# Patient Record
Sex: Male | Born: 1990 | Race: White | Hispanic: No | Marital: Single
Health system: Southern US, Community
[De-identification: ages and names within clinical notes are randomized; demographics above are authoritative.]

---

## 2020-12-17 ENCOUNTER — Other Ambulatory Visit: Payer: Self-pay

## 2020-12-17 ENCOUNTER — Emergency Department (HOSPITAL_COMMUNITY)
Admission: EM | Admit: 2020-12-17 | Discharge: 2020-12-17 | Disposition: A | Discharge status: Home / Self Care | Attending: Emergency Medicine | Admitting: Emergency Medicine

## 2020-12-17 DIAGNOSIS — R42 Dizziness and giddiness: Secondary | ICD-10-CM | POA: Diagnosis not present

## 2020-12-17 DIAGNOSIS — R4781 Slurred speech: Secondary | ICD-10-CM | POA: Diagnosis not present

## 2020-12-17 DIAGNOSIS — T510X1A Toxic effect of ethanol, accidental (unintentional), initial encounter: Secondary | ICD-10-CM | POA: Insufficient documentation

## 2020-12-17 DIAGNOSIS — Z789 Other specified health status: Secondary | ICD-10-CM

## 2020-12-17 LAB — CBC WITH DIFFERENTIAL/PLATELET
Abs Immature Granulocytes: 0.01 10*3/uL (ref 0.00–0.07)
Basophils Absolute: 0.1 10*3/uL (ref 0.0–0.1)
Basophils Relative: 1 %
Eosinophils Absolute: 0.1 10*3/uL (ref 0.0–0.5)
Eosinophils Relative: 1 %
HCT: 43.3 % (ref 39.0–52.0)
Hemoglobin: 14.8 g/dL (ref 13.0–17.0)
Immature Granulocytes: 0 %
Lymphocytes Relative: 49 %
Lymphs Abs: 3.8 10*3/uL (ref 0.7–4.0)
MCH: 32.1 pg (ref 26.0–34.0)
MCHC: 34.2 g/dL (ref 30.0–36.0)
MCV: 93.9 fL (ref 80.0–100.0)
Monocytes Absolute: 0.5 10*3/uL (ref 0.1–1.0)
Monocytes Relative: 6 %
Neutro Abs: 3.4 10*3/uL (ref 1.7–7.7)
Neutrophils Relative %: 43 %
Platelets: 160 10*3/uL (ref 150–400)
RBC: 4.61 MIL/uL (ref 4.22–5.81)
RDW: 13.4 % (ref 11.5–15.5)
WBC: 7.9 10*3/uL (ref 4.0–10.5)
nRBC: 0 % (ref 0.0–0.2)

## 2020-12-17 LAB — COMPREHENSIVE METABOLIC PANEL
ALT: 130 U/L — ABNORMAL HIGH (ref 0–44)
AST: 84 U/L — ABNORMAL HIGH (ref 15–41)
Albumin: 4.5 g/dL (ref 3.5–5.0)
Alkaline Phosphatase: 56 U/L (ref 38–126)
Anion gap: 9 (ref 5–15)
BUN: 9 mg/dL (ref 6–20)
CO2: 26 mmol/L (ref 22–32)
Calcium: 8.8 mg/dL — ABNORMAL LOW (ref 8.9–10.3)
Chloride: 106 mmol/L (ref 98–111)
Creatinine, Ser: 0.88 mg/dL (ref 0.61–1.24)
GFR, Estimated: >60 mL/min (ref >60)
Glucose, Bld: 117 mg/dL — ABNORMAL HIGH (ref 70–99)
Potassium: 4 mmol/L (ref 3.5–5.1)
Sodium: 141 mmol/L (ref 135–145)
Total Bilirubin: 0.4 mg/dL (ref 0.3–1.2)
Total Protein: 7.7 g/dL (ref 6.5–8.1)

## 2020-12-17 LAB — ETHANOL: Alcohol, Ethyl (B): 121 mg/dL — ABNORMAL HIGH (ref <10)

## 2020-12-17 MED ORDER — SODIUM CHLORIDE 0.9 % IV BOLUS
1000.0000 mL | Freq: Once | INTRAVENOUS | Status: AC
  Administered 2020-12-17: 21:00:00 1000 mL via INTRAVENOUS

## 2020-12-17 NOTE — ED Provider Notes (Signed)
[facility]-EMERGENCY DEPT Provider Note   CSN: [id number] Arrival date & time: 12/17/20  1937     History Chief Complaint  Patient presents with  . Ingestion    Randall Griffin is a 29 y.o. male.  Patient is a 29 year old male with no significant past medical history.  He is brought from the jail for evaluation of ingestion.  Patient and his cellmate apparently got a hold of liquid hand sanitizer and each consumed this.  Patient was brought here for slurred speech and dizziness.  He denies to me he is having any abdominal pain or vomiting.  The history is provided by the patient.  Ingestion This is a new problem. The problem occurs constantly. The problem has not changed since onset.Nothing aggravates the symptoms. Nothing relieves the symptoms.       No past medical history on file.  There are no problems to display for this patient.        No family history on file.     Home Medications Prior to Admission medications   Not on File    Allergies    Patient has no allergy information on record.  Review of Systems   Review of Systems  All other systems reviewed and are negative.   Physical Exam Updated Vital Signs BP 132/80   Pulse 69   Temp 98 F (36.7 C) (Axillary)   Resp (!) 22   SpO2 100%   Physical Exam Vitals and nursing note reviewed.  Constitutional:      General: He is not in acute distress.    Appearance: He is well-developed and well-nourished. He is not diaphoretic.  HENT:     Head: Normocephalic and atraumatic.     Mouth/Throat:     Mouth: Oropharynx is clear and moist.  Cardiovascular:     Rate and Rhythm: Normal rate and regular rhythm.     Heart sounds: No murmur heard. No friction rub.  Pulmonary:     Effort: Pulmonary effort is normal. No respiratory distress.     Breath sounds: Normal breath sounds. No wheezing or rales.  Abdominal:     General: Bowel sounds are normal. There is no distension.      Palpations: Abdomen is soft.     Tenderness: There is no abdominal tenderness.  Musculoskeletal:        General: No edema. Normal range of motion.     Cervical back: Normal range of motion and neck supple.  Skin:    General: Skin is warm and dry.  Neurological:     General: No focal deficit present.     Mental Status: He is alert and oriented to person, place, and time.     Cranial Nerves: No cranial nerve deficit.     Motor: No weakness.     Coordination: Coordination normal.     ED Results / Procedures / Treatments   Labs (all labs ordered are listed, but only abnormal results are displayed) Labs Reviewed  ETHANOL - Abnormal; Notable for the following components:      Result Value   Alcohol, Ethyl (B) 121 (*)    All other components within normal limits  CBC WITH DIFFERENTIAL/PLATELET  COMPREHENSIVE METABOLIC PANEL  URINALYSIS, ROUTINE W REFLEX MICROSCOPIC  RAPID URINE DRUG SCREEN, HOSP PERFORMED    EKG None  Radiology No results found.  Procedures Procedures (including critical care time)  Medications Ordered in ED Medications  sodium chloride 0.9 % bolus 1,000 mL (1,000 mLs  Intravenous New Bag/Given 12/17/20 2034)    ED Course  I have reviewed the triage vital signs and the nursing notes.  Pertinent labs & imaging results that were available during my care of the patient were reviewed by me and considered in my medical decision making (see chart for details).    MDM Rules/Calculators/A&P  Patient brought here by EMS from the jail for evaluation of hand sanitizer ingestion.  Patient drank hand sanitizer to become intoxicated.  He denies other ingestion.  He denies any suicidal or homicidal ideation.  Patient has been observed for nearly 3 hours.  He appears stable and has no complaints.  His work-up is unremarkable with the exception of blood alcohol level of 121.  At this point, I see no indication for admission or other work-up.  Patient will be discharged  with as needed return.  Final Clinical Impression(s) / ED Diagnoses Final diagnoses:  None    Rx / DC Orders ED Discharge Orders    None       Geoffery Lyons, MD 12/17/20 2203

## 2020-12-17 NOTE — ED Triage Notes (Signed)
Patient present after drinking hand sanitizer in jail. Patient currently intoxicated. He has no complaints.   136/72 BP 72 HR 16 RR

## 2020-12-17 NOTE — Discharge Instructions (Addendum)
Return to the emergency department if you develop severe abdominal pain, vomiting, or other new and concerning symptoms.

## 2020-12-18 ENCOUNTER — Emergency Department (HOSPITAL_COMMUNITY)
Admission: EM | Admit: 2020-12-18 | Discharge: 2020-12-19 | Disposition: A | Discharge status: Home / Self Care | Attending: Emergency Medicine | Admitting: Emergency Medicine

## 2020-12-18 ENCOUNTER — Emergency Department (HOSPITAL_COMMUNITY)

## 2020-12-18 ENCOUNTER — Other Ambulatory Visit: Payer: Self-pay

## 2020-12-18 DIAGNOSIS — J338 Other polyp of sinus: Secondary | ICD-10-CM

## 2020-12-18 DIAGNOSIS — R7401 Elevation of levels of liver transaminase levels: Secondary | ICD-10-CM | POA: Insufficient documentation

## 2020-12-18 DIAGNOSIS — J339 Nasal polyp, unspecified: Secondary | ICD-10-CM | POA: Diagnosis not present

## 2020-12-18 DIAGNOSIS — R569 Unspecified convulsions: Secondary | ICD-10-CM | POA: Diagnosis not present

## 2020-12-18 LAB — CBC WITH DIFFERENTIAL/PLATELET
Abs Immature Granulocytes: 0.02 10*3/uL (ref 0.00–0.07)
Basophils Absolute: 0 10*3/uL (ref 0.0–0.1)
Basophils Relative: 1 %
Eosinophils Absolute: 0.2 10*3/uL (ref 0.0–0.5)
Eosinophils Relative: 3 %
HCT: 41.2 % (ref 39.0–52.0)
Hemoglobin: 14.3 g/dL (ref 13.0–17.0)
Immature Granulocytes: 0 %
Lymphocytes Relative: 49 %
Lymphs Abs: 4.2 10*3/uL — ABNORMAL HIGH (ref 0.7–4.0)
MCH: 32.1 pg (ref 26.0–34.0)
MCHC: 34.7 g/dL (ref 30.0–36.0)
MCV: 92.4 fL (ref 80.0–100.0)
Monocytes Absolute: 0.8 10*3/uL (ref 0.1–1.0)
Monocytes Relative: 9 %
Neutro Abs: 3.2 10*3/uL (ref 1.7–7.7)
Neutrophils Relative %: 38 %
Platelets: 167 10*3/uL (ref 150–400)
RBC: 4.46 MIL/uL (ref 4.22–5.81)
RDW: 13.5 % (ref 11.5–15.5)
WBC: 8.6 10*3/uL (ref 4.0–10.5)
nRBC: 0 % (ref 0.0–0.2)

## 2020-12-18 MED ORDER — ACETAMINOPHEN 325 MG PO TABS
650.0000 mg | ORAL_TABLET | Freq: Once | ORAL | Status: AC
  Administered 2020-12-18: 650 mg via ORAL
  Filled 2020-12-18: qty 2

## 2020-12-18 NOTE — ED Notes (Signed)
Patient transported to CT 

## 2020-12-18 NOTE — ED Triage Notes (Signed)
Brought in by [facility] EMS from jail - c/o possible seizure (no history)- pt fell to the floor. Upon EMS arrival pt AOx4.. neck pain and headache (8/10)

## 2020-12-18 NOTE — ED Provider Notes (Signed)
[facility] EMERGENCY DEPARTMENT Provider Note   CSN: [id number] Arrival date & time: 12/18/20  2254   History Chief Complaint  Patient presents with  . Seizures    Randall Griffin is a 29 y.o. male.  The history is provided by the patient and the EMS personnel.  Seizures He is an inmate in jail and was brought in by EMS after having had a seizure.  Patient states that he knows that he felt funny and the next thing he knew, he woke up on the floor.  He had urinary incontinence but no fecal incontinence and no bit lip or tongue.  He has no history of seizures.  He had been in the ED yesterday after ingesting hand sanitizer, but he denies any ingestion today and denies any drug use.  No past medical history on file.  There are no problems to display for this patient.   ** The histories are not reviewed yet. Please review them in the "History" navigator section and refresh this SmartLink.     No family history on file.     Home Medications Prior to Admission medications   Not on File    Allergies    Patient has no allergy information on record.  Review of Systems   Review of Systems  Neurological: Positive for seizures.  All other systems reviewed and are negative.   Physical Exam Updated Vital Signs BP 130/79   Pulse 79   Resp (!) 32   SpO2 98%   Physical Exam Vitals and nursing note reviewed.   29 year old male, resting comfortably and in no acute distress. Vital signs are significant for elevated respiratory rate. Oxygen saturation is 98%, which is normal. Head is normocephalic and atraumatic. PERRLA, EOMI. Oropharynx is clear. Neck is nontender and supple without adenopathy or JVD. Back is nontender and there is no CVA tenderness. Lungs are clear without rales, wheezes, or rhonchi. Chest is nontender. Heart has regular rate and rhythm without murmur. Abdomen is soft, flat, nontender without masses or hepatosplenomegaly and  peristalsis is normoactive. Extremities have no cyanosis or edema, full range of motion is present. Skin is warm and dry without rash. Neurologic: Mental status is normal, cranial nerves are intact, moves all extremities equally.  ED Results / Procedures / Treatments   Labs (all labs ordered are listed, but only abnormal results are displayed) Labs Reviewed  COMPREHENSIVE METABOLIC PANEL - Abnormal; Notable for the following components:      Result Value   AST 71 (*)    ALT 117 (*)    All other components within normal limits  CBC WITH DIFFERENTIAL/PLATELET - Abnormal; Notable for the following components:   Lymphs Abs 4.2 (*)    All other components within normal limits  ETHANOL  RAPID URINE DRUG SCREEN, HOSP PERFORMED  LACTIC ACID, PLASMA    EKG EKG Interpretation  Date/Time:  Friday December 18 2020 22:56:21 EST Ventricular Rate:  66 PR Interval:    QRS Duration: 94 QT Interval:  400 QTC Calculation: 420 R Axis:   87 Text Interpretation: Sinus rhythm Normal ECG No old tracing to compare Confirmed by Dione Booze ([id number]) on 12/18/2020 11:01:18 PM   Radiology CT Head Wo Contrast  Result Date: 12/18/2020 CLINICAL DATA:  Seizure. EXAM: CT HEAD WITHOUT CONTRAST TECHNIQUE: Contiguous axial images were obtained from the base of the skull through the vertex without intravenous contrast. COMPARISON:  None. FINDINGS: Brain: No evidence of acute infarction, hemorrhage, hydrocephalus, extra-axial  collection or mass lesion/mass effect. Vascular: No hyperdense vessel or unexpected calcification. Skull: Normal. Negative for fracture or focal lesion. Sinuses/Orbits: A 1.1 cm x 0.6 cm left maxillary sinus polyp versus mucous retention cyst is seen. Other: None. IMPRESSION: 1. No acute intracranial abnormality. 2. 1.1 cm x 0.6 cm left maxillary sinus polyp versus mucous retention cyst. Electronically Signed   By: Aram Candela M.D.   On: 12/18/2020 23:36     Procedures Procedures  Medications Ordered in ED Medications  acetaminophen (TYLENOL) tablet 650 mg (650 mg Oral Given 12/18/20 2342)    ED Course  I have reviewed the triage vital signs and the nursing notes.  Pertinent labs & imaging results that were available during my care of the patient were reviewed by me and considered in my medical decision making (see chart for details).  MDM Rules/Calculators/A&P Seizure, first time.  Seizure work-up initiated including labs and CT scan.  Old records are reviewed confirming ED visit yesterday for alcohol intoxication from hand sanitizer ingestion.  Suspect seizure is related to drug use.  Seizure work-up is negative.  CT shows no acute process, but incidental finding of polyp in the left maxillary sinus.  Metabolic panel shows mild elevation of transaminases which had been present previously, probably related to alcohol abuse.  Ethanol level is 0 and drug screen is negative.  He is discharged with the referrals to neurology to obtain outpatient EEG, and to ENT to further evaluate his polyp.  Final Clinical Impression(s) / ED Diagnoses Final diagnoses:  Seizure (HCC)  Elevated transaminase level  Polyp, sinus maxillary    Rx / DC Orders ED Discharge Orders    None       Dione Booze, MD 12/19/20 0205

## 2020-12-18 NOTE — ED Notes (Signed)
Urinal placed at bedside.

## 2020-12-18 NOTE — ED Notes (Signed)
Both side rails padded. Suction machine set up prepared. Nasal cannula and NRB placed at bedside.

## 2020-12-18 NOTE — ED Notes (Signed)
Pt back to room at this time, still accompanied by prison guard. Pt calm and cooperative.

## 2020-12-19 LAB — COMPREHENSIVE METABOLIC PANEL
ALT: 117 U/L — ABNORMAL HIGH (ref 0–44)
AST: 71 U/L — ABNORMAL HIGH (ref 15–41)
Albumin: 3.9 g/dL (ref 3.5–5.0)
Alkaline Phosphatase: 52 U/L (ref 38–126)
Anion gap: 7 (ref 5–15)
BUN: 10 mg/dL (ref 6–20)
CO2: 27 mmol/L (ref 22–32)
Calcium: 8.9 mg/dL (ref 8.9–10.3)
Chloride: 105 mmol/L (ref 98–111)
Creatinine, Ser: 0.92 mg/dL (ref 0.61–1.24)
GFR, Estimated: >60 mL/min (ref >60)
Glucose, Bld: 88 mg/dL (ref 70–99)
Potassium: 4.2 mmol/L (ref 3.5–5.1)
Sodium: 139 mmol/L (ref 135–145)
Total Bilirubin: 0.6 mg/dL (ref 0.3–1.2)
Total Protein: 6.6 g/dL (ref 6.5–8.1)

## 2020-12-19 LAB — RAPID URINE DRUG SCREEN, HOSP PERFORMED
Amphetamines: NOT DETECTED
Barbiturates: NOT DETECTED
Benzodiazepines: NOT DETECTED
Cocaine: NOT DETECTED
Opiates: NOT DETECTED
Tetrahydrocannabinol: NOT DETECTED

## 2020-12-19 LAB — ETHANOL: Alcohol, Ethyl (B): <10 mg/dL (ref <10)

## 2020-12-19 LAB — LACTIC ACID, PLASMA: Lactic Acid, Venous: 0.9 mmol/L (ref 0.5–1.9)

## 2020-12-19 NOTE — ED Notes (Signed)
Discharge instructions gone over with patient, paperwork given to prison guard at bedside. Ride back to jail on the way to pick pt up.

## 2020-12-19 NOTE — Discharge Instructions (Addendum)
You had a seizure tonight.  About half of the people who have one seizure will never have another one.  Therefore, you are not being started on medication tonight, but you will need to see a neurologist to get an EEG as an outpatient.  If you ever have a second seizure, you will need to be started on medications to prevent seizures.  The law [place] says that you may not drive a car if you have had a seizure in the previous 6 months.  Your CT scan shows you have a polyp in one of your sinuses.  Please follow-up with the ear nose throat physician for further evaluation of that.

## 2020-12-19 NOTE — ED Notes (Signed)
Ride by patient arrived.

## 2021-04-02 ENCOUNTER — Emergency Department (HOSPITAL_COMMUNITY)
Admission: EM | Admit: 2021-04-02 | Discharge: 2021-04-02 | Disposition: A | Discharge status: Home / Self Care | Attending: Emergency Medicine | Admitting: Emergency Medicine

## 2021-04-02 ENCOUNTER — Emergency Department (HOSPITAL_COMMUNITY)

## 2021-04-02 DIAGNOSIS — M542 Cervicalgia: Secondary | ICD-10-CM | POA: Insufficient documentation

## 2021-04-02 DIAGNOSIS — R519 Headache, unspecified: Secondary | ICD-10-CM | POA: Diagnosis not present

## 2021-04-02 DIAGNOSIS — W19XXXA Unspecified fall, initial encounter: Secondary | ICD-10-CM | POA: Insufficient documentation

## 2021-04-02 DIAGNOSIS — G40909 Epilepsy, unspecified, not intractable, without status epilepticus: Secondary | ICD-10-CM | POA: Insufficient documentation

## 2021-04-02 DIAGNOSIS — S025XXA Fracture of tooth (traumatic), initial encounter for closed fracture: Secondary | ICD-10-CM | POA: Insufficient documentation

## 2021-04-02 DIAGNOSIS — R569 Unspecified convulsions: Secondary | ICD-10-CM

## 2021-04-02 DIAGNOSIS — R110 Nausea: Secondary | ICD-10-CM | POA: Diagnosis not present

## 2021-04-02 DIAGNOSIS — S0993XA Unspecified injury of face, initial encounter: Secondary | ICD-10-CM | POA: Diagnosis present

## 2021-04-02 DIAGNOSIS — Y92149 Unspecified place in prison as the place of occurrence of the external cause: Secondary | ICD-10-CM | POA: Diagnosis not present

## 2021-04-02 MED ORDER — LORAZEPAM 2 MG/ML IJ SOLN
1.0000 mg | Freq: Once | INTRAMUSCULAR | Status: AC
  Administered 2021-04-02: 1 mg via INTRAVENOUS
  Filled 2021-04-02: qty 1

## 2021-04-02 MED ORDER — SODIUM CHLORIDE 0.9 % IV BOLUS
1000.0000 mL | Freq: Once | INTRAVENOUS | Status: AC
  Administered 2021-04-02: 1000 mL via INTRAVENOUS

## 2021-04-02 MED ORDER — LEVETIRACETAM 500 MG PO TABS: 500.0000 mg | ORAL_TABLET | Freq: Two times a day (BID) | ORAL | 0 refills | Status: AC

## 2021-04-02 MED ORDER — LEVETIRACETAM IN NACL 1000 MG/100ML IV SOLN
1000.0000 mg | Freq: Two times a day (BID) | INTRAVENOUS | Status: DC
  Administered 2021-04-02: 1000 mg via INTRAVENOUS
  Filled 2021-04-02: qty 100

## 2021-04-02 NOTE — ED Triage Notes (Signed)
Ems brings pt in from jail for a seizure. 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 Pt reports not using the past 2 days and states when he doesn't use he has seizures.

## 2021-04-02 NOTE — Discharge Instructions (Signed)
As discussed, with your seizure disorder, it is important you monitor your condition and take your medication as prescribed.  Do not hesitate to return here for concerning changes, otherwise follow-up with your physician.  The small portion of your tooth that was broken, may be evaluated, possibly repaired by your dentist when you have time as well.

## 2021-04-02 NOTE — ED Provider Notes (Signed)
[facility]-EMERGENCY DEPT Provider Note   CSN: [id number] Arrival date & time: 04/02/21  1409     History Chief Complaint  Patient presents with  . Seizures    Randall Griffin is a 30 y.o. male.  HPI Patient presents from jail after a seizure.  Patient notes a seizure disorder, states that he takes no medication regularly for this as he is never followed up with outpatient providers.  Patient notes that he has been using heroin, Xanax, last 2 days ago.  He has been incarcerated since about that time.  Today he complains of ongoing discomfort from withdrawal, gluten shakiness, nausea.  At jail patient had a witnessed event of falling to the ground.  He reports prodromal lightheadedness, sense of unwell sensation prior to this.  He then recalls awakening with EMS providers above him.  Currently complains of pain in his neck, head, seemingly broke the inferior portion of his his central upper tooth.     Past social polysubstance abuse, currently incarcerated Past medical seizures Past surgical, denies    Home Medications Prior to Admission medications   Medication Sig Start Date End Date Taking? Authorizing Provider  levETIRAcetam (KEPPRA) 500 MG tablet Take 1 tablet (500 mg total) by mouth 2 (two) times daily. 04/02/21  Yes Gerhard Munch, MD    Allergies    Ibuprofen  Review of Systems   Review of Systems  Constitutional:       Per HPI, otherwise negative  HENT:       Per HPI, otherwise negative  Respiratory:       Per HPI, otherwise negative  Cardiovascular:       Per HPI, otherwise negative  Gastrointestinal: Negative for vomiting.  Endocrine:       Negative aside from HPI  Genitourinary:       Neg aside from HPI   Musculoskeletal:       Per HPI, otherwise negative  Skin: Negative.   Neurological: Positive for seizures. Negative for syncope.    Physical Exam Updated Vital Signs BP (!) 153/76   Pulse 63   Temp 98.6 F (37 C) (Oral)    Resp 20   Ht 5\' 10"  (1.778 m)   Wt 74.8 kg   SpO2 100%   BMI 23.68 kg/m   Physical Exam Vitals and nursing note reviewed.  Constitutional:      General: He is not in acute distress.    Appearance: He is well-developed.  HENT:     Head: Normocephalic and atraumatic.     Mouth/Throat:   Eyes:     Conjunctiva/sclera: Conjunctivae normal.  Neck:   Cardiovascular:     Rate and Rhythm: Normal rate and regular rhythm.  Pulmonary:     Effort: Pulmonary effort is normal. No respiratory distress.     Breath sounds: No stridor.  Abdominal:     General: There is no distension.  Skin:    General: Skin is warm and dry.  Neurological:     Mental Status: He is alert and oriented to person, place, and time.     Cranial Nerves: No cranial nerve deficit.     Motor: No weakness, tremor, atrophy or abnormal muscle tone.  Psychiatric:        Mood and Affect: Mood is anxious.     ED Results / Procedures / Treatments   Labs (all labs ordered are listed, but only abnormal results are displayed) Labs Reviewed - No data to display  EKG None  Radiology CT Head Wo Contrast  Result Date: 04/02/2021 CLINICAL DATA:  Trauma.  Seizure. EXAM: CT HEAD WITHOUT CONTRAST CT CERVICAL SPINE WITHOUT CONTRAST TECHNIQUE: Multidetector CT imaging of the head and cervical spine was performed following the standard protocol without intravenous contrast. Multiplanar CT image reconstructions of the cervical spine were also generated. COMPARISON:  Head CT 12/18/2020 FINDINGS: CT HEAD FINDINGS Brain: There is no evidence of an acute infarct, intracranial hemorrhage, mass, midline shift, or extra-axial fluid collection. The ventricles and sulci are normal. Vascular: No hyperdense vessel. Skull: No fracture or suspicious osseous lesion. Sinuses/Orbits: Visualized paranasal sinuses and mastoid air cells are clear. Unremarkable orbits. Other: None. CT CERVICAL SPINE FINDINGS There was severe motion artifact through the  mid and lower cervical spine on the initial scan. Repeat imaging was obtained and is of good quality. Alignment: Straightening of the normal cervical lordosis. No listhesis. Skull base and vertebrae: No acute fracture or suspicious osseous lesion. Soft tissues and spinal canal: No prevertebral fluid or swelling. No visible canal hematoma. Disc levels: Mild disc bulging and endplate spurring at C4-5 and C5-6 without high-grade stenosis. Upper chest: Clear lung apices. Other: None. IMPRESSION: 1. Negative head CT. 2. No evidence of acute fracture or traumatic subluxation in the cervical spine. Electronically Signed   By: Sebastian Ache M.D.   On: 04/02/2021 16:38   CT Cervical Spine Wo Contrast  Result Date: 04/02/2021 CLINICAL DATA:  Trauma.  Seizure. EXAM: CT HEAD WITHOUT CONTRAST CT CERVICAL SPINE WITHOUT CONTRAST TECHNIQUE: Multidetector CT imaging of the head and cervical spine was performed following the standard protocol without intravenous contrast. Multiplanar CT image reconstructions of the cervical spine were also generated. COMPARISON:  Head CT 12/18/2020 FINDINGS: CT HEAD FINDINGS Brain: There is no evidence of an acute infarct, intracranial hemorrhage, mass, midline shift, or extra-axial fluid collection. The ventricles and sulci are normal. Vascular: No hyperdense vessel. Skull: No fracture or suspicious osseous lesion. Sinuses/Orbits: Visualized paranasal sinuses and mastoid air cells are clear. Unremarkable orbits. Other: None. CT CERVICAL SPINE FINDINGS There was severe motion artifact through the mid and lower cervical spine on the initial scan. Repeat imaging was obtained and is of good quality. Alignment: Straightening of the normal cervical lordosis. No listhesis. Skull base and vertebrae: No acute fracture or suspicious osseous lesion. Soft tissues and spinal canal: No prevertebral fluid or swelling. No visible canal hematoma. Disc levels: Mild disc bulging and endplate spurring at C4-5 and  C5-6 without high-grade stenosis. Upper chest: Clear lung apices. Other: None. IMPRESSION: 1. Negative head CT. 2. No evidence of acute fracture or traumatic subluxation in the cervical spine. Electronically Signed   By: Sebastian Ache M.D.   On: 04/02/2021 16:38    Procedures Procedures   Medications Ordered in ED Medications  levETIRAcetam (KEPPRA) IVPB 1000 mg/100 mL premix (0 mg Intravenous Stopped 04/02/21 1650)  sodium chloride 0.9 % bolus 1,000 mL (1,000 mLs Intravenous New Bag/Given 04/02/21 1554)  LORazepam (ATIVAN) injection 1 mg (1 mg Intravenous Given 04/02/21 1555)    ED Course  I have reviewed the triage vital signs and the nursing notes.  Pertinent labs & imaging results that were available during my care of the patient were reviewed by me and considered in my medical decision making (see chart for details).    5:18 PM Patient in no distress, resting in right lateral decubitus position. I reviewed findings with him, and with the deputy assigned to him. CT head, CT neck, both unremarkable.  Both reviewed.  He has received fluids, Keppra, Ativan has had no additional seizure activity. Here, with no evidence for intracranial or bone lesions, the patient is appropriate for returning to the correctional facility. W notes the patient can receive medication, which has been prescribed for his seizures. Patient can follow-up for dental care upon discharge from the facility.  Final Clinical Impression(s) / ED Diagnoses Final diagnoses:  Seizure (HCC)  Closed fracture of tooth, initial encounter    Rx / DC Orders ED Discharge Orders         Ordered    levETIRAcetam (KEPPRA) 500 MG tablet  2 times daily        04/02/21 1650           Gerhard Munch, MD 04/02/21 1719

## 2021-04-02 NOTE — ED Notes (Signed)
Pt removed c-collar.

## 2021-04-04 ENCOUNTER — Emergency Department (HOSPITAL_COMMUNITY)
Admission: EM | Admit: 2021-04-04 | Discharge: 2021-04-05 | Discharge status: Against Medical Advice | Attending: Emergency Medicine | Admitting: Emergency Medicine

## 2021-04-04 ENCOUNTER — Other Ambulatory Visit: Payer: Self-pay

## 2021-04-04 DIAGNOSIS — S0081XA Abrasion of other part of head, initial encounter: Secondary | ICD-10-CM | POA: Diagnosis not present

## 2021-04-04 DIAGNOSIS — W19XXXA Unspecified fall, initial encounter: Secondary | ICD-10-CM | POA: Diagnosis not present

## 2021-04-04 DIAGNOSIS — Z79899 Other long term (current) drug therapy: Secondary | ICD-10-CM | POA: Insufficient documentation

## 2021-04-04 DIAGNOSIS — R569 Unspecified convulsions: Secondary | ICD-10-CM | POA: Insufficient documentation

## 2021-04-04 DIAGNOSIS — S0990XA Unspecified injury of head, initial encounter: Secondary | ICD-10-CM | POA: Diagnosis present

## 2021-04-04 MED ORDER — LACTATED RINGERS IV BOLUS: 1000.0000 mL | Freq: Once | INTRAVENOUS | Status: DC

## 2021-04-04 MED ORDER — LEVETIRACETAM 500 MG PO TABS
1500.0000 mg | ORAL_TABLET | Freq: Once | ORAL | Status: AC
  Administered 2021-04-04: 1500 mg via ORAL
  Filled 2021-04-04: qty 3

## 2021-04-04 MED ORDER — SODIUM CHLORIDE 0.9 % IV SOLN
2000.0000 mg | Freq: Once | INTRAVENOUS | Status: DC
  Filled 2021-04-04: qty 20

## 2021-04-04 NOTE — ED Triage Notes (Signed)
Pt arrives VIA EMS from [facility] center. Pt fell during an unwitnessed seizure, has a head lac and neck pain. Pt had a seizure at 7 this morning for which the jail nurse gave an unknown seizure med.

## 2021-04-05 ENCOUNTER — Ambulatory Visit (HOSPITAL_COMMUNITY): Admission: RE | Admit: 2021-04-05 | Source: Ambulatory Visit

## 2021-04-05 NOTE — ED Provider Notes (Signed)
[facility]-EMERG[facility] DEPT Provider Note   CSN: [id number] Arrival date & time: 04/04/21  2136     History Chief Complaint  Patient presents with  . Seizures    Randall Griffin is a 30 y.o. male.   Seizures Seizure activity on arrival: no   Seizure type:  Unable to specify Initial focality:  Unable to specify Episode characteristics: abnormal movements   Postictal symptoms: confusion   Return to baseline: yes   Severity:  Mild Timing:  Once Progression:  Worsening Context: not alcohol withdrawal   Recent head injury:  During the event PTA treatment:  None History of seizures: yes        No past medical history on file.  There are no problems to display for this patient.   No family history on file.     Home Medications Prior to Admission medications   Medication Sig Start Date End Date Taking? Authorizing Provider  acetaminophen (TYLENOL) 325 MG tablet Take 650 mg by mouth every 8 (eight) hours as needed for mild pain or moderate pain. 2 tablets by mouth every 8 hours as needed for muscle pain or spasm, for 7 days   Yes [provider]  levETIRAcetam (KEPPRA) 500 MG tablet Take 1 tablet (500 mg total) by mouth 2 (two) times daily. 04/02/21  Yes Gerhard Munch, MD  loperamide (IMODIUM A-D) 2 MG tablet Take 2 mg by mouth 3 (three) times daily as needed for diarrhea or loose stools. Three times daily for 7 days as needed   Yes [provider]  meclizine (ANTIVERT) 25 MG tablet Take 25 mg by mouth 3 (three) times daily as needed for nausea. Three times daily as needed for 7 days   Yes [provider]    Allergies    Ibuprofen  Review of Systems   Review of Systems  Neurological: Positive for seizures.  All other systems reviewed and are negative.   Physical Exam Updated Vital Signs BP (!) 125/105   Pulse 84   Temp 98 F (36.7 C)   Resp (!) 22   Ht 5\' 10"  (1.778 m)   Wt 74.8 kg   SpO2 99%   BMI 23.68  kg/m   Physical Exam Vitals and nursing note reviewed.  Constitutional:      Appearance: He is well-developed.  HENT:     Head: Normocephalic and atraumatic.     Mouth/Throat:     Mouth: Mucous membranes are moist.     Pharynx: Oropharynx is clear.  Eyes:     Pupils: Pupils are equal, round, and reactive to light.  Cardiovascular:     Rate and Rhythm: Normal rate.  Pulmonary:     Effort: Pulmonary effort is normal. No respiratory distress.  Abdominal:     General: Abdomen is flat. There is no distension.  Musculoskeletal:        General: Normal range of motion.     Cervical back: Normal range of motion.  Skin:    General: Skin is warm and dry.     Comments: Abrasion to forehead  Neurological:     General: No focal deficit present.     Mental Status: He is alert.     ED Results / Procedures / Treatments   Labs (all labs ordered are listed, but only abnormal results are displayed) Labs Reviewed  CBC WITH DIFFERENTIAL/PLATELET  COMPREHENSIVE METABOLIC PANEL  LEVETIRACETAM LEVEL    EKG None  Radiology No results found.  Procedures Procedures  Medications Ordered in ED Medications  lactated ringers bolus 1,000 mL (has no administration in time range)  levETIRAcetam (KEPPRA) tablet 1,500 mg (1,500 mg Oral Given 04/04/21 2359)    ED Course  I have reviewed the triage vital signs and the nursing notes.  Pertinent labs & imaging results that were available during my care of the patient were reviewed by me and considered in my medical decision making (see chart for details).    MDM Rules/Calculators/A&P                          Was going to workup for repeat seizure. Is refusing all workup after I told him I wouldn't give pain medication beyond tylenol. Is competent to make this decision and understands risks of doing so.   Final Clinical Impression(s) / ED Diagnoses Final diagnoses:  Seizure-like activity [facility])    Rx / DC Orders ED Discharge Orders     None       Cassadee Vanzandt, Barbara Cower, MD 04/05/21 0006

## 2021-04-05 NOTE — ED Notes (Signed)
Pt insists on leaving against medical advice. Md and charge nurse notified. Explained to pt that his medical condition could worsen if he leaves against the Dr's orders. Pt expressed understanding and still wanted to leave.

## 2022-05-13 IMAGING — CT CT HEAD W/O CM
3 series · 14 of 45 positions shown, 16 images · non-contrast
Comparison: Head CT 12/18/2020

CLINICAL DATA: Trauma.  Seizure.

EXAM:
CT HEAD WITHOUT CONTRAST
CT CERVICAL SPINE WITHOUT CONTRAST
TECHNIQUE: Multidetector CT imaging of the head and cervical spine was
performed following the standard protocol without intravenous
contrast. Multiplanar CT image reconstructions of the cervical spine
were also generated.

[Series 3: head wo · axial · 0.47mm/px · z∈[-82,+33]mm · 8 of 28 slices shown, 10 images]
[im 3/28  brain]
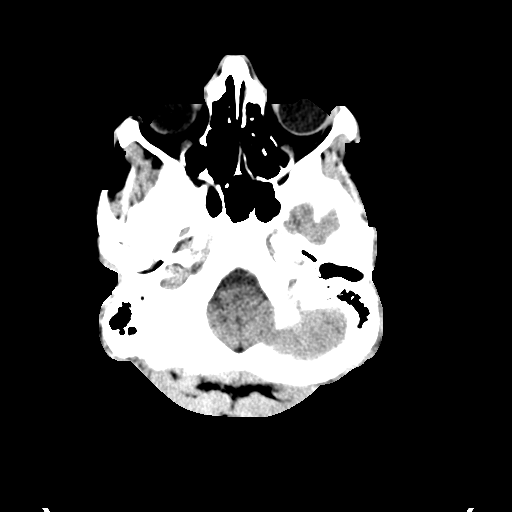
[im 3/28  bone]
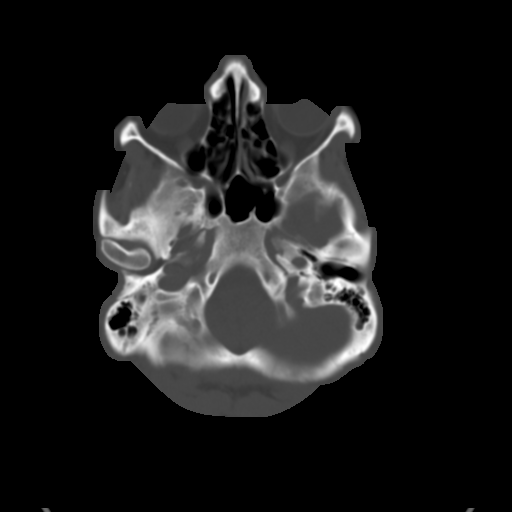
[im 6/28  brain]
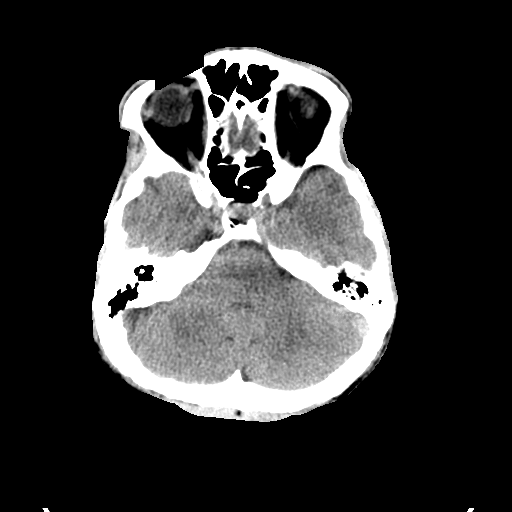
[im 10/28  brain]
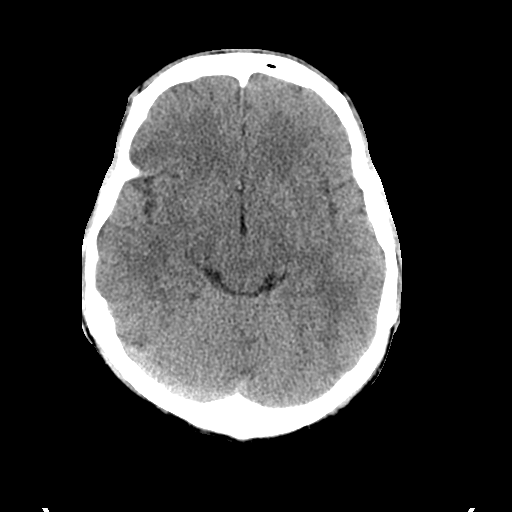
[im 13/28  brain]
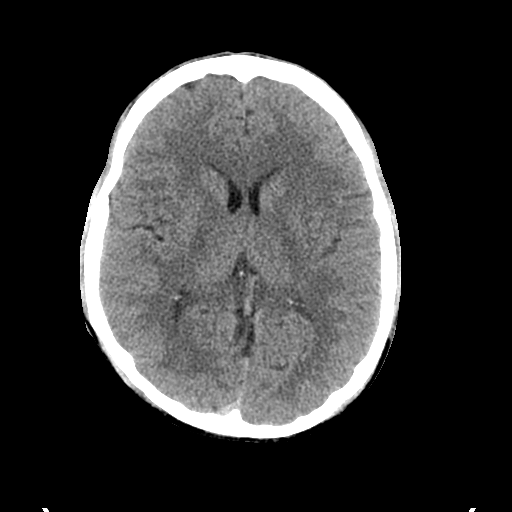
[im 16/28  brain]
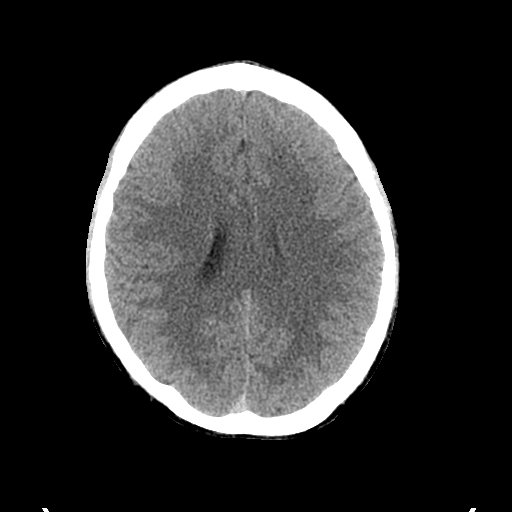
[im 16/28  bone]
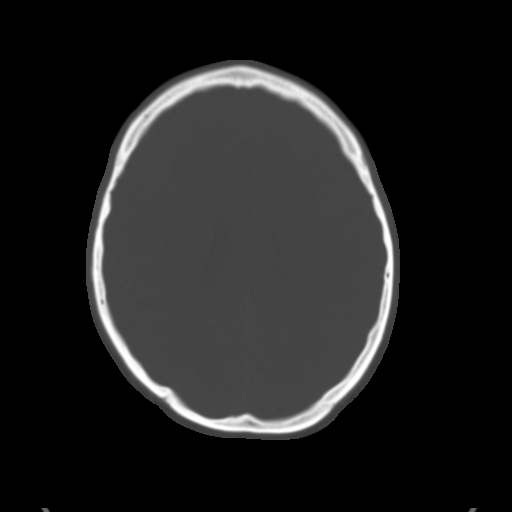
[im 19/28  brain]
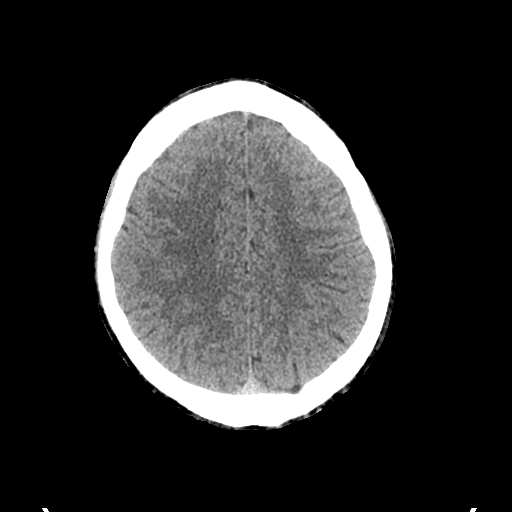
[im 23/28  brain]
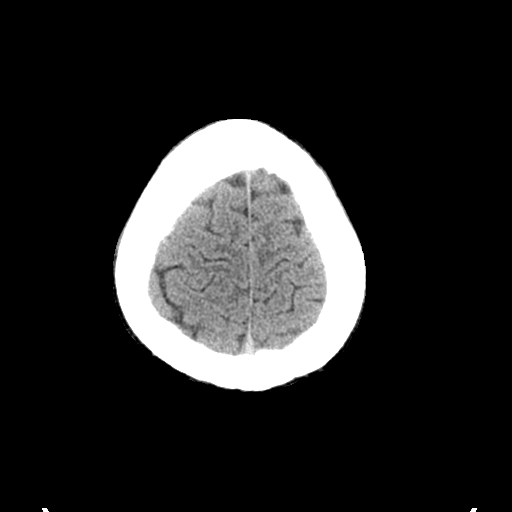
[im 26/28  brain]
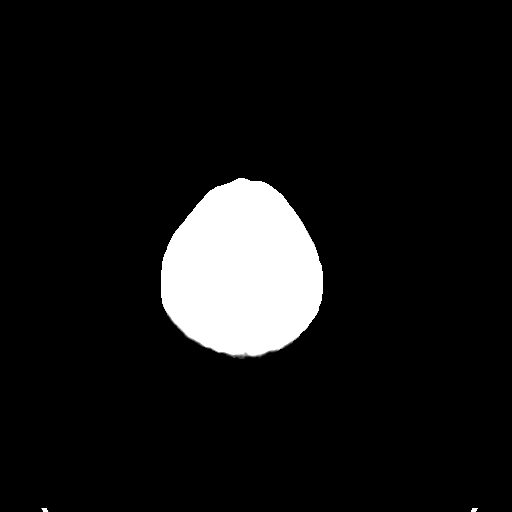

[Series 4: coronal soft tissue · coronal · 0.35mm/px · 3 of 63 slices shown]
[im 21/63  brain]
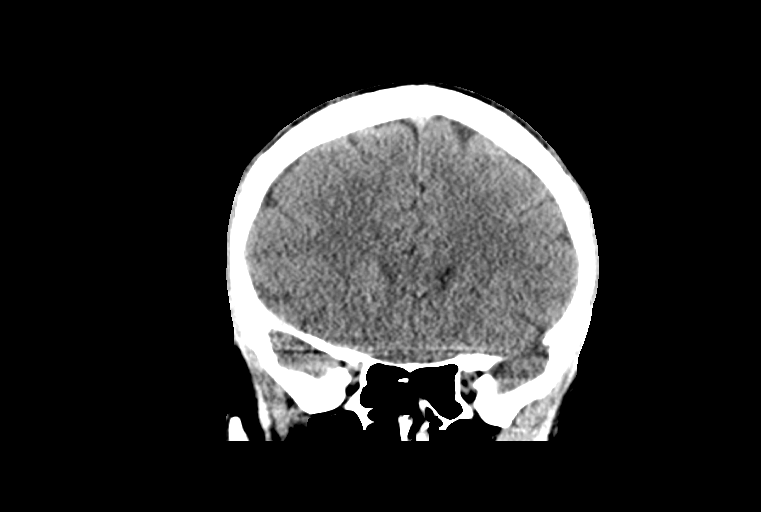
[im 28/63  brain]
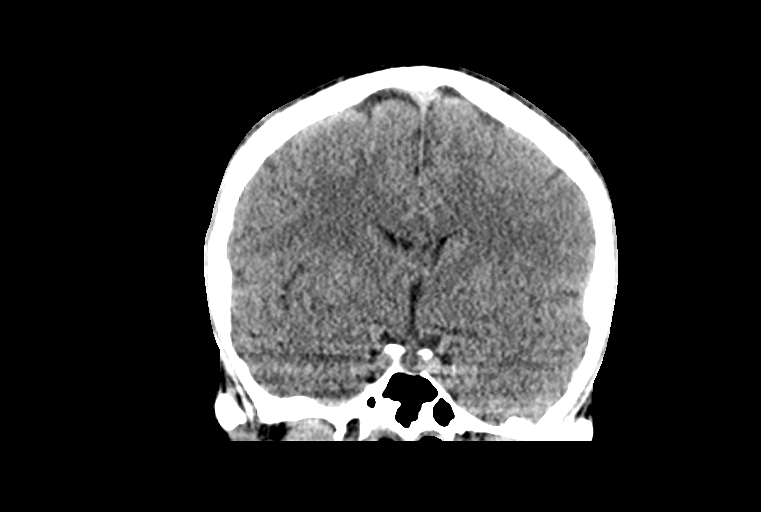
[im 35/63  brain]
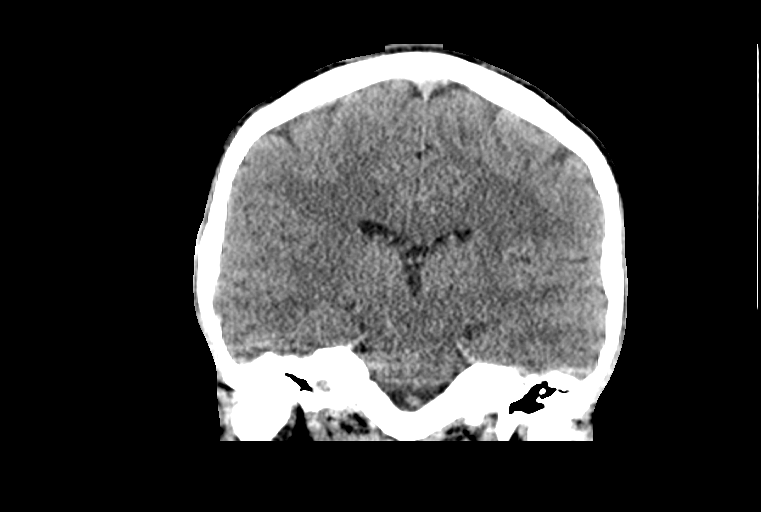

[Series 5: sagittal soft tissue · sagittal · 0.33mm/px · 3 of 55 slices shown]
[im 19/55  brain]
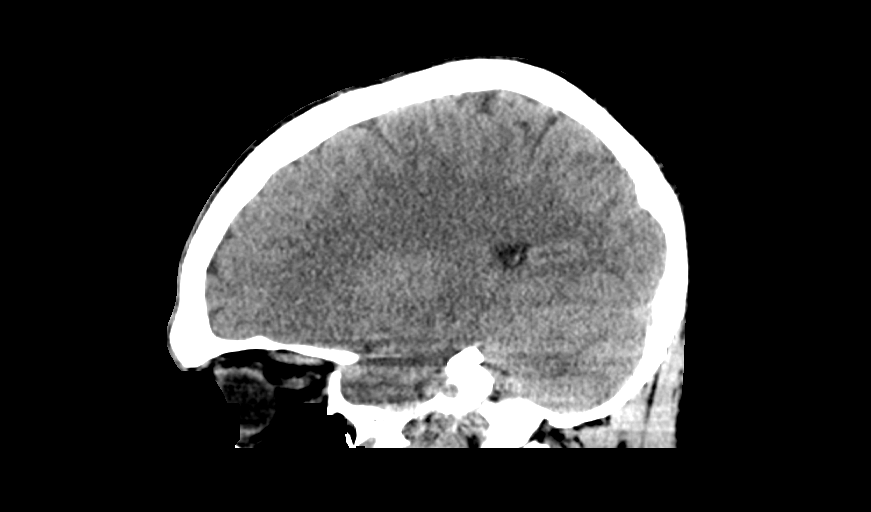
[im 28/55  brain]
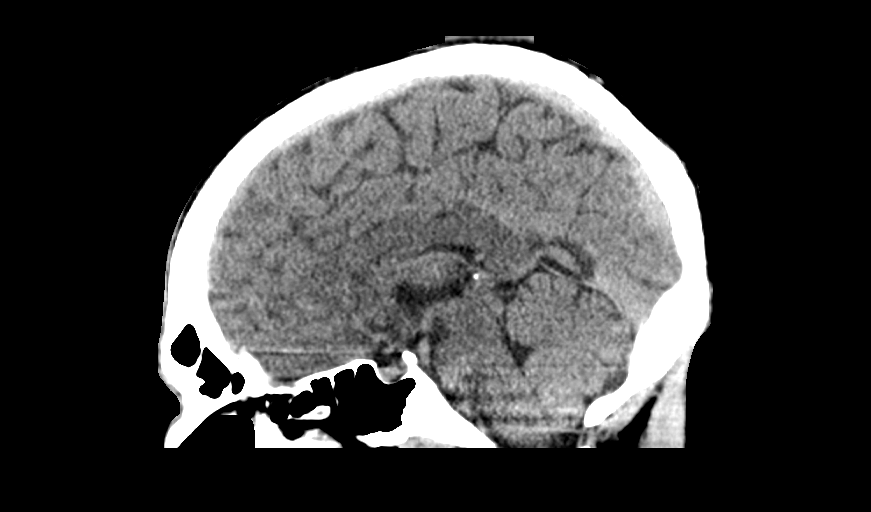
[im 37/55  brain]
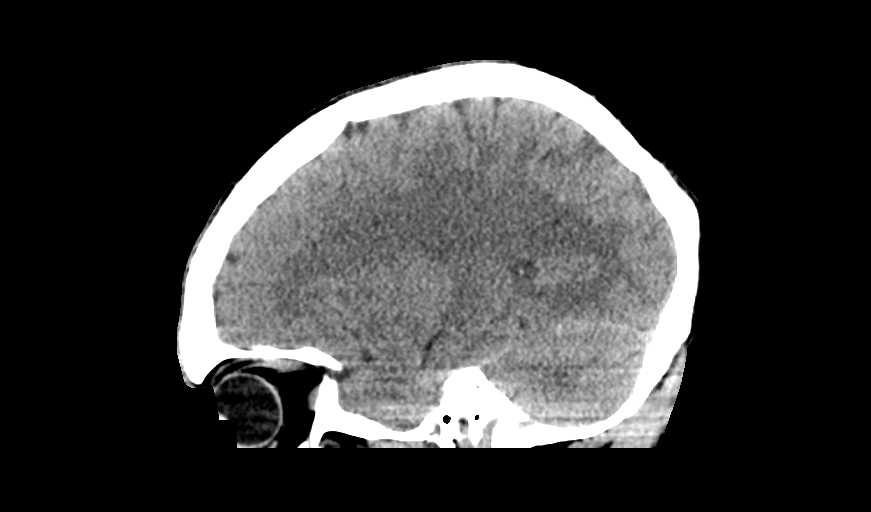

[14 of 45 positions shown; findings below may reference images not displayed]

FINDINGS: CT HEAD FINDINGS

Brain: There is no evidence of an acute infarct, intracranial
hemorrhage, mass, midline shift, or extra-axial fluid collection.
The ventricles and sulci are normal.

Vascular: No hyperdense vessel.

Skull: No fracture or suspicious osseous lesion.

Sinuses/Orbits: Visualized paranasal sinuses and mastoid air cells
are clear. Unremarkable orbits.

Other: None.

CT CERVICAL SPINE FINDINGS

There was severe motion artifact through the mid and lower cervical
spine on the initial scan. Repeat imaging was obtained and is of
good quality.

Alignment: Straightening of the normal cervical lordosis. No
listhesis.

Skull base and vertebrae: No acute fracture or suspicious osseous
lesion.

Soft tissues and spinal canal: No prevertebral fluid or swelling. No
visible canal hematoma.

Disc levels: Mild disc bulging and endplate spurring at C4-5 and
C5-6 without high-grade stenosis.

Upper chest: Clear lung apices.

Other: None.
IMPRESSION: 1. Negative head CT.
2. No evidence of acute fracture or traumatic subluxation in the
cervical spine.
